# Patient Record
Sex: Female | Born: 1945 | Race: White | Hispanic: No | Marital: Married
Health system: Southern US, Community
[De-identification: ages and names within clinical notes are randomized; demographics above are authoritative.]

---

## 1997-05-07 ENCOUNTER — Ambulatory Visit (HOSPITAL_COMMUNITY): Admission: RE | Admit: 1997-05-07 | Discharge: 1997-05-07 | Payer: Self-pay | Admitting: Family Medicine

## 1998-02-19 ENCOUNTER — Other Ambulatory Visit: Admission: RE | Admit: 1998-02-19 | Discharge: 1998-02-19 | Payer: Self-pay | Admitting: Family Medicine

## 1998-07-22 ENCOUNTER — Ambulatory Visit (HOSPITAL_COMMUNITY): Admission: RE | Admit: 1998-07-22 | Discharge: 1998-07-22 | Payer: Self-pay | Admitting: Specialist

## 1998-07-22 ENCOUNTER — Encounter: Payer: Self-pay | Admitting: Specialist

## 1999-06-26 ENCOUNTER — Other Ambulatory Visit: Admission: RE | Admit: 1999-06-26 | Discharge: 1999-06-26 | Payer: Self-pay | Admitting: *Deleted

## 1999-09-25 ENCOUNTER — Encounter: Payer: Self-pay | Admitting: *Deleted

## 1999-09-25 ENCOUNTER — Ambulatory Visit (HOSPITAL_COMMUNITY): Admission: RE | Admit: 1999-09-25 | Discharge: 1999-09-25 | Payer: Self-pay | Admitting: *Deleted

## 2000-09-30 ENCOUNTER — Encounter: Payer: Self-pay | Admitting: *Deleted

## 2000-09-30 ENCOUNTER — Ambulatory Visit (HOSPITAL_COMMUNITY): Admission: RE | Admit: 2000-09-30 | Discharge: 2000-09-30 | Payer: Self-pay | Admitting: *Deleted

## 2002-01-09 ENCOUNTER — Ambulatory Visit (HOSPITAL_COMMUNITY): Admission: RE | Admit: 2002-01-09 | Discharge: 2002-01-09 | Payer: Self-pay | Admitting: *Deleted

## 2002-01-09 ENCOUNTER — Encounter: Payer: Self-pay | Admitting: *Deleted

## 2003-03-28 ENCOUNTER — Ambulatory Visit (HOSPITAL_COMMUNITY): Admission: RE | Admit: 2003-03-28 | Discharge: 2003-03-28 | Payer: Self-pay | Admitting: Family Medicine

## 2003-07-09 ENCOUNTER — Other Ambulatory Visit: Admission: RE | Admit: 2003-07-09 | Discharge: 2003-07-09 | Payer: Self-pay | Admitting: Family Medicine

## 2003-07-18 ENCOUNTER — Ambulatory Visit (HOSPITAL_COMMUNITY): Admission: RE | Admit: 2003-07-18 | Discharge: 2003-07-18 | Payer: Self-pay | Admitting: Gastroenterology

## 2004-07-03 ENCOUNTER — Ambulatory Visit (HOSPITAL_COMMUNITY): Admission: RE | Admit: 2004-07-03 | Discharge: 2004-07-03 | Payer: Self-pay | Admitting: Family Medicine

## 2004-11-17 ENCOUNTER — Other Ambulatory Visit: Admission: RE | Admit: 2004-11-17 | Discharge: 2004-11-17 | Payer: Self-pay | Admitting: *Deleted

## 2005-10-28 ENCOUNTER — Ambulatory Visit (HOSPITAL_COMMUNITY): Admission: RE | Admit: 2005-10-28 | Discharge: 2005-10-28 | Payer: Self-pay | Admitting: Family Medicine

## 2005-11-25 ENCOUNTER — Other Ambulatory Visit: Admission: RE | Admit: 2005-11-25 | Discharge: 2005-11-25 | Payer: Self-pay | Admitting: *Deleted

## 2007-03-13 ENCOUNTER — Ambulatory Visit (HOSPITAL_COMMUNITY): Admission: RE | Admit: 2007-03-13 | Discharge: 2007-03-13 | Payer: Self-pay | Admitting: Family Medicine

## 2007-04-20 ENCOUNTER — Other Ambulatory Visit: Admission: RE | Admit: 2007-04-20 | Discharge: 2007-04-20 | Payer: Self-pay | Admitting: Family Medicine

## 2008-05-01 ENCOUNTER — Ambulatory Visit (HOSPITAL_COMMUNITY): Admission: RE | Admit: 2008-05-01 | Discharge: 2008-05-01 | Payer: Self-pay | Admitting: Family Medicine

## 2008-05-13 ENCOUNTER — Other Ambulatory Visit: Admission: RE | Admit: 2008-05-13 | Discharge: 2008-05-13 | Payer: Self-pay | Admitting: Family Medicine

## 2009-06-18 ENCOUNTER — Ambulatory Visit (HOSPITAL_COMMUNITY): Admission: RE | Admit: 2009-06-18 | Discharge: 2009-06-18 | Payer: Self-pay | Admitting: Family Medicine

## 2009-11-21 ENCOUNTER — Other Ambulatory Visit: Admission: RE | Admit: 2009-11-21 | Discharge: 2009-11-21 | Payer: Self-pay | Admitting: Family Medicine

## 2010-03-08 ENCOUNTER — Encounter: Payer: Self-pay | Admitting: Family Medicine

## 2010-07-03 NOTE — Op Note (Signed)
[name], Sharon Gomez                          ACCOUNT NO.:  [id number]   MEDICAL RECORD NO.:  [id number]                   PATIENT TYPE:  AMB   LOCATION:  ENDO                                 FACILITY:  [facility]   PHYSICIAN:  Bernette Redbird, M.D.                DATE OF BIRTH:  October 12, 1945   DATE OF PROCEDURE:  07/18/2003  DATE OF DISCHARGE:                                 OPERATIVE REPORT   PROCEDURE:  Colonoscopy.   INDICATION:  Screening for colon cancer.   FINDINGS:  Mild sigmoid diverticulosis.   DESCRIPTION OF PROCEDURE:  The nature, purpose, and risks of the procedure  had been discussed with the patient who provided written consent.  Sedation  was fentanyl 50 mcg and Versed 4 mg IV without arrhythmias or desaturation.  The Olympus adjustable-tension pediatric video colonoscope was advanced to  the cecum, turning the patient into the supine position, then the right  lateral decubitus position, and finally to the left lateral decubitus  position with some external abdominal compression to get the tip of the  scope down to the base of the cecum where the appendiceal orifice was  readily visualized.  Pullback was then performed.  The quality of the prep  was very good, and it is felt that all areas were well seen.   There was minimal to mild sigmoid diverticulosis, but this was otherwise a  normal exam without evidence of polyps, cancer, colitis, or vascular  malformations.  Retroflexion in the rectum was unremarkable.  No biopsies  were obtained.  The patient tolerated the procedure well, and there were no  apparent complications.   IMPRESSION:  Normal screening colonoscopy except for very mild sigmoid  diverticulosis (V76.51).   PLAN:  Consider sigmoidoscopic screening in 5 years since the patient has no  worrisome risk factors or symptoms.                                               Bernette Redbird, M.D.    RB/MEDQ  D:  07/18/2003  T:  07/18/2003  Job:  [id number]   cc:    Clovis Riley, FNP

## 2010-09-07 ENCOUNTER — Other Ambulatory Visit (HOSPITAL_COMMUNITY): Payer: Self-pay | Admitting: Family Medicine

## 2010-09-07 DIAGNOSIS — Z1231 Encounter for screening mammogram for malignant neoplasm of breast: Secondary | ICD-10-CM

## 2010-09-10 ENCOUNTER — Ambulatory Visit (HOSPITAL_COMMUNITY)
Admission: RE | Admit: 2010-09-10 | Discharge: 2010-09-10 | Disposition: A | Discharge status: Home / Self Care | Payer: BC Managed Care – PPO | Source: Ambulatory Visit | Attending: Family Medicine | Admitting: Family Medicine

## 2010-09-10 DIAGNOSIS — Z1231 Encounter for screening mammogram for malignant neoplasm of breast: Secondary | ICD-10-CM | POA: Insufficient documentation

## 2010-12-01 ENCOUNTER — Other Ambulatory Visit: Payer: Self-pay | Admitting: Family Medicine

## 2010-12-01 ENCOUNTER — Other Ambulatory Visit (HOSPITAL_COMMUNITY): Payer: Self-pay | Admitting: Family Medicine

## 2010-12-01 ENCOUNTER — Other Ambulatory Visit (HOSPITAL_COMMUNITY)
Admission: RE | Admit: 2010-12-01 | Discharge: 2010-12-01 | Disposition: A | Discharge status: Home / Self Care | Payer: Medicare Other | Source: Ambulatory Visit | Attending: Family Medicine | Admitting: Family Medicine

## 2010-12-01 DIAGNOSIS — Z124 Encounter for screening for malignant neoplasm of cervix: Secondary | ICD-10-CM | POA: Insufficient documentation

## 2010-12-01 DIAGNOSIS — M8580 Other specified disorders of bone density and structure, unspecified site: Secondary | ICD-10-CM

## 2010-12-28 ENCOUNTER — Ambulatory Visit (HOSPITAL_COMMUNITY): Admission: RE | Admit: 2010-12-28 | Payer: BC Managed Care – PPO | Source: Ambulatory Visit

## 2011-11-10 ENCOUNTER — Other Ambulatory Visit (HOSPITAL_COMMUNITY): Payer: Self-pay | Admitting: Family Medicine

## 2011-11-10 DIAGNOSIS — Z1231 Encounter for screening mammogram for malignant neoplasm of breast: Secondary | ICD-10-CM

## 2011-11-26 ENCOUNTER — Ambulatory Visit (HOSPITAL_COMMUNITY): Payer: Medicare Other

## 2011-12-15 ENCOUNTER — Other Ambulatory Visit (HOSPITAL_COMMUNITY): Payer: Self-pay | Admitting: Family Medicine

## 2011-12-15 DIAGNOSIS — Z1231 Encounter for screening mammogram for malignant neoplasm of breast: Secondary | ICD-10-CM

## 2011-12-16 ENCOUNTER — Ambulatory Visit (HOSPITAL_COMMUNITY)
Admission: RE | Admit: 2011-12-16 | Discharge: 2011-12-16 | Disposition: A | Discharge status: Home / Self Care | Payer: Medicare Other | Source: Ambulatory Visit | Attending: Family Medicine | Admitting: Family Medicine

## 2011-12-16 DIAGNOSIS — Z1231 Encounter for screening mammogram for malignant neoplasm of breast: Secondary | ICD-10-CM

## 2012-05-18 ENCOUNTER — Other Ambulatory Visit (HOSPITAL_COMMUNITY)
Admission: RE | Admit: 2012-05-18 | Discharge: 2012-05-18 | Disposition: A | Discharge status: Home / Self Care | Payer: Medicare Other | Source: Ambulatory Visit | Attending: Family Medicine | Admitting: Family Medicine

## 2012-05-18 ENCOUNTER — Other Ambulatory Visit: Payer: Self-pay | Admitting: Family Medicine

## 2012-05-18 DIAGNOSIS — Z124 Encounter for screening for malignant neoplasm of cervix: Secondary | ICD-10-CM | POA: Insufficient documentation

## 2013-05-10 ENCOUNTER — Other Ambulatory Visit (HOSPITAL_COMMUNITY): Payer: Self-pay | Admitting: Family Medicine

## 2013-05-10 DIAGNOSIS — Z1231 Encounter for screening mammogram for malignant neoplasm of breast: Secondary | ICD-10-CM

## 2013-05-15 ENCOUNTER — Ambulatory Visit (HOSPITAL_COMMUNITY)
Admission: RE | Admit: 2013-05-15 | Discharge: 2013-05-15 | Disposition: A | Discharge status: Home / Self Care | Payer: Medicare Other | Source: Ambulatory Visit | Attending: Family Medicine | Admitting: Family Medicine

## 2013-05-15 DIAGNOSIS — Z1231 Encounter for screening mammogram for malignant neoplasm of breast: Secondary | ICD-10-CM | POA: Insufficient documentation

## 2013-05-17 ENCOUNTER — Other Ambulatory Visit: Payer: Self-pay | Admitting: Family Medicine

## 2013-05-17 DIAGNOSIS — R928 Other abnormal and inconclusive findings on diagnostic imaging of breast: Secondary | ICD-10-CM

## 2013-05-25 ENCOUNTER — Ambulatory Visit
Admission: RE | Admit: 2013-05-25 | Discharge: 2013-05-25 | Disposition: A | Discharge status: Home / Self Care | Payer: Medicare Other | Source: Ambulatory Visit | Attending: Family Medicine | Admitting: Family Medicine

## 2013-05-25 ENCOUNTER — Ambulatory Visit
Admission: RE | Admit: 2013-05-25 | Discharge: 2013-05-25 | Disposition: A | Discharge status: Home / Self Care | Payer: BC Managed Care – PPO | Source: Ambulatory Visit | Attending: Family Medicine | Admitting: Family Medicine

## 2013-05-25 DIAGNOSIS — R928 Other abnormal and inconclusive findings on diagnostic imaging of breast: Secondary | ICD-10-CM

## 2013-11-14 ENCOUNTER — Other Ambulatory Visit: Payer: Self-pay | Admitting: Family Medicine

## 2013-11-14 DIAGNOSIS — N6310 Unspecified lump in the right breast, unspecified quadrant: Secondary | ICD-10-CM

## 2013-11-19 ENCOUNTER — Encounter (INDEPENDENT_AMBULATORY_CARE_PROVIDER_SITE_OTHER): Payer: Self-pay

## 2013-11-19 ENCOUNTER — Ambulatory Visit
Admission: RE | Admit: 2013-11-19 | Discharge: 2013-11-19 | Disposition: A | Discharge status: Home / Self Care | Payer: Medicare Other | Source: Ambulatory Visit | Attending: Family Medicine | Admitting: Family Medicine

## 2013-11-19 ENCOUNTER — Other Ambulatory Visit: Payer: Self-pay | Admitting: Family Medicine

## 2013-11-19 DIAGNOSIS — N6310 Unspecified lump in the right breast, unspecified quadrant: Secondary | ICD-10-CM

## 2013-12-26 ENCOUNTER — Other Ambulatory Visit: Payer: Self-pay | Admitting: Gastroenterology

## 2014-05-27 ENCOUNTER — Other Ambulatory Visit: Payer: Self-pay | Admitting: Family Medicine

## 2014-05-27 DIAGNOSIS — N6310 Unspecified lump in the right breast, unspecified quadrant: Secondary | ICD-10-CM

## 2014-05-31 ENCOUNTER — Ambulatory Visit
Admission: RE | Admit: 2014-05-31 | Discharge: 2014-05-31 | Disposition: A | Discharge status: Home / Self Care | Payer: Medicare Other | Source: Ambulatory Visit | Attending: Family Medicine | Admitting: Family Medicine

## 2014-05-31 DIAGNOSIS — N6310 Unspecified lump in the right breast, unspecified quadrant: Secondary | ICD-10-CM

## 2015-05-27 ENCOUNTER — Other Ambulatory Visit: Payer: Self-pay | Admitting: Family Medicine

## 2015-05-27 DIAGNOSIS — N6489 Other specified disorders of breast: Secondary | ICD-10-CM

## 2015-06-03 ENCOUNTER — Ambulatory Visit
Admission: RE | Admit: 2015-06-03 | Discharge: 2015-06-03 | Disposition: A | Discharge status: Home / Self Care | Payer: Medicare Other | Source: Ambulatory Visit | Attending: Family Medicine | Admitting: Family Medicine

## 2015-06-03 DIAGNOSIS — N6489 Other specified disorders of breast: Secondary | ICD-10-CM

## 2015-08-13 ENCOUNTER — Other Ambulatory Visit: Payer: Self-pay | Admitting: Family Medicine

## 2015-08-13 ENCOUNTER — Other Ambulatory Visit (HOSPITAL_COMMUNITY)
Admission: RE | Admit: 2015-08-13 | Discharge: 2015-08-13 | Disposition: A | Discharge status: Home / Self Care | Payer: Medicare Other | Source: Ambulatory Visit | Attending: Family Medicine | Admitting: Family Medicine

## 2015-08-13 DIAGNOSIS — Z124 Encounter for screening for malignant neoplasm of cervix: Secondary | ICD-10-CM | POA: Insufficient documentation

## 2015-08-15 LAB — CYTOLOGY - PAP

## 2016-07-06 ENCOUNTER — Other Ambulatory Visit: Payer: Self-pay | Admitting: Family Medicine

## 2016-07-06 DIAGNOSIS — Z1231 Encounter for screening mammogram for malignant neoplasm of breast: Secondary | ICD-10-CM

## 2016-07-20 ENCOUNTER — Ambulatory Visit
Admission: RE | Admit: 2016-07-20 | Discharge: 2016-07-20 | Disposition: A | Discharge status: Home / Self Care | Payer: Medicare Other | Source: Ambulatory Visit | Attending: Family Medicine | Admitting: Family Medicine

## 2016-07-20 DIAGNOSIS — Z1231 Encounter for screening mammogram for malignant neoplasm of breast: Secondary | ICD-10-CM

## 2017-07-21 ENCOUNTER — Other Ambulatory Visit: Payer: Self-pay | Admitting: Family Medicine

## 2017-07-21 DIAGNOSIS — Z1231 Encounter for screening mammogram for malignant neoplasm of breast: Secondary | ICD-10-CM

## 2017-07-22 ENCOUNTER — Ambulatory Visit
Admission: RE | Admit: 2017-07-22 | Discharge: 2017-07-22 | Disposition: A | Discharge status: Home / Self Care | Payer: Medicare Other | Source: Ambulatory Visit | Attending: Family Medicine | Admitting: Family Medicine

## 2017-07-22 DIAGNOSIS — Z1231 Encounter for screening mammogram for malignant neoplasm of breast: Secondary | ICD-10-CM

## 2018-09-26 ENCOUNTER — Other Ambulatory Visit: Payer: Self-pay | Admitting: Family Medicine

## 2018-09-26 DIAGNOSIS — Z1231 Encounter for screening mammogram for malignant neoplasm of breast: Secondary | ICD-10-CM

## 2018-11-08 ENCOUNTER — Other Ambulatory Visit: Payer: Self-pay

## 2018-11-08 ENCOUNTER — Ambulatory Visit
Admission: RE | Admit: 2018-11-08 | Discharge: 2018-11-08 | Disposition: A | Discharge status: Home / Self Care | Payer: Medicare Other | Source: Ambulatory Visit | Attending: Family Medicine | Admitting: Family Medicine

## 2018-11-08 DIAGNOSIS — Z1231 Encounter for screening mammogram for malignant neoplasm of breast: Secondary | ICD-10-CM

## 2019-11-26 ENCOUNTER — Other Ambulatory Visit: Payer: Self-pay | Admitting: Family Medicine

## 2019-11-26 DIAGNOSIS — Z1231 Encounter for screening mammogram for malignant neoplasm of breast: Secondary | ICD-10-CM

## 2019-12-31 ENCOUNTER — Ambulatory Visit
Admission: RE | Admit: 2019-12-31 | Discharge: 2019-12-31 | Disposition: A | Discharge status: Home / Self Care | Payer: Medicare Other | Source: Ambulatory Visit | Attending: Family Medicine | Admitting: Family Medicine

## 2019-12-31 ENCOUNTER — Other Ambulatory Visit: Payer: Self-pay

## 2019-12-31 DIAGNOSIS — Z1231 Encounter for screening mammogram for malignant neoplasm of breast: Secondary | ICD-10-CM | POA: Diagnosis not present

## 2020-01-21 DIAGNOSIS — M8580 Other specified disorders of bone density and structure, unspecified site: Secondary | ICD-10-CM | POA: Diagnosis not present

## 2020-01-21 DIAGNOSIS — Z0000 Encounter for general adult medical examination without abnormal findings: Secondary | ICD-10-CM | POA: Diagnosis not present

## 2020-01-21 DIAGNOSIS — E559 Vitamin D deficiency, unspecified: Secondary | ICD-10-CM | POA: Diagnosis not present

## 2020-01-21 DIAGNOSIS — J069 Acute upper respiratory infection, unspecified: Secondary | ICD-10-CM | POA: Diagnosis not present

## 2020-01-21 DIAGNOSIS — Z86010 Personal history of colonic polyps: Secondary | ICD-10-CM | POA: Diagnosis not present

## 2020-01-21 DIAGNOSIS — E7800 Pure hypercholesterolemia, unspecified: Secondary | ICD-10-CM | POA: Diagnosis not present

## 2020-01-21 DIAGNOSIS — R7303 Prediabetes: Secondary | ICD-10-CM | POA: Diagnosis not present

## 2020-01-21 DIAGNOSIS — Z1159 Encounter for screening for other viral diseases: Secondary | ICD-10-CM | POA: Diagnosis not present

## 2020-01-23 DIAGNOSIS — E7800 Pure hypercholesterolemia, unspecified: Secondary | ICD-10-CM | POA: Diagnosis not present

## 2020-01-23 DIAGNOSIS — Z86010 Personal history of colonic polyps: Secondary | ICD-10-CM | POA: Diagnosis not present

## 2020-01-23 DIAGNOSIS — R7303 Prediabetes: Secondary | ICD-10-CM | POA: Diagnosis not present

## 2020-01-23 DIAGNOSIS — Z0000 Encounter for general adult medical examination without abnormal findings: Secondary | ICD-10-CM | POA: Diagnosis not present

## 2020-01-23 DIAGNOSIS — M8580 Other specified disorders of bone density and structure, unspecified site: Secondary | ICD-10-CM | POA: Diagnosis not present

## 2020-01-23 DIAGNOSIS — Z20822 Contact with and (suspected) exposure to covid-19: Secondary | ICD-10-CM | POA: Diagnosis not present

## 2020-01-23 DIAGNOSIS — Z1159 Encounter for screening for other viral diseases: Secondary | ICD-10-CM | POA: Diagnosis not present

## 2020-01-23 DIAGNOSIS — J069 Acute upper respiratory infection, unspecified: Secondary | ICD-10-CM | POA: Diagnosis not present

## 2020-01-23 DIAGNOSIS — E559 Vitamin D deficiency, unspecified: Secondary | ICD-10-CM | POA: Diagnosis not present

## 2020-01-25 DIAGNOSIS — E559 Vitamin D deficiency, unspecified: Secondary | ICD-10-CM | POA: Diagnosis not present

## 2020-01-25 DIAGNOSIS — Z0000 Encounter for general adult medical examination without abnormal findings: Secondary | ICD-10-CM | POA: Diagnosis not present

## 2020-01-25 DIAGNOSIS — J069 Acute upper respiratory infection, unspecified: Secondary | ICD-10-CM | POA: Diagnosis not present

## 2020-01-25 DIAGNOSIS — M8580 Other specified disorders of bone density and structure, unspecified site: Secondary | ICD-10-CM | POA: Diagnosis not present

## 2020-01-25 DIAGNOSIS — E7800 Pure hypercholesterolemia, unspecified: Secondary | ICD-10-CM | POA: Diagnosis not present

## 2020-01-25 DIAGNOSIS — Z1159 Encounter for screening for other viral diseases: Secondary | ICD-10-CM | POA: Diagnosis not present

## 2020-01-25 DIAGNOSIS — Z86010 Personal history of colonic polyps: Secondary | ICD-10-CM | POA: Diagnosis not present

## 2020-01-25 DIAGNOSIS — R7303 Prediabetes: Secondary | ICD-10-CM | POA: Diagnosis not present

## 2020-02-06 ENCOUNTER — Other Ambulatory Visit: Payer: Self-pay | Admitting: Family Medicine

## 2020-02-06 DIAGNOSIS — M8580 Other specified disorders of bone density and structure, unspecified site: Secondary | ICD-10-CM

## 2020-09-08 DIAGNOSIS — E559 Vitamin D deficiency, unspecified: Secondary | ICD-10-CM | POA: Diagnosis not present

## 2020-09-08 DIAGNOSIS — R7303 Prediabetes: Secondary | ICD-10-CM | POA: Diagnosis not present

## 2020-09-08 DIAGNOSIS — E7800 Pure hypercholesterolemia, unspecified: Secondary | ICD-10-CM | POA: Diagnosis not present

## 2020-09-08 DIAGNOSIS — Z6831 Body mass index (BMI) 31.0-31.9, adult: Secondary | ICD-10-CM | POA: Diagnosis not present

## 2020-09-08 DIAGNOSIS — L259 Unspecified contact dermatitis, unspecified cause: Secondary | ICD-10-CM | POA: Diagnosis not present

## 2020-09-08 DIAGNOSIS — M8580 Other specified disorders of bone density and structure, unspecified site: Secondary | ICD-10-CM | POA: Diagnosis not present

## 2021-02-10 ENCOUNTER — Other Ambulatory Visit: Payer: Self-pay | Admitting: Home Modifications

## 2021-02-10 DIAGNOSIS — Z1231 Encounter for screening mammogram for malignant neoplasm of breast: Secondary | ICD-10-CM

## 2021-03-06 ENCOUNTER — Ambulatory Visit
Admission: RE | Admit: 2021-03-06 | Discharge: 2021-03-06 | Disposition: A | Discharge status: Home / Self Care | Payer: Medicare PPO | Source: Ambulatory Visit | Attending: Home Modifications | Admitting: Home Modifications

## 2021-03-06 DIAGNOSIS — Z1231 Encounter for screening mammogram for malignant neoplasm of breast: Secondary | ICD-10-CM | POA: Diagnosis not present

## 2021-03-10 DIAGNOSIS — Z0000 Encounter for general adult medical examination without abnormal findings: Secondary | ICD-10-CM | POA: Diagnosis not present

## 2021-03-10 DIAGNOSIS — Z6830 Body mass index (BMI) 30.0-30.9, adult: Secondary | ICD-10-CM | POA: Diagnosis not present

## 2021-03-10 DIAGNOSIS — E559 Vitamin D deficiency, unspecified: Secondary | ICD-10-CM | POA: Diagnosis not present

## 2021-03-10 DIAGNOSIS — R7303 Prediabetes: Secondary | ICD-10-CM | POA: Diagnosis not present

## 2021-03-10 DIAGNOSIS — Z86010 Personal history of colonic polyps: Secondary | ICD-10-CM | POA: Diagnosis not present

## 2021-03-10 DIAGNOSIS — M8580 Other specified disorders of bone density and structure, unspecified site: Secondary | ICD-10-CM | POA: Diagnosis not present

## 2021-03-10 DIAGNOSIS — E7800 Pure hypercholesterolemia, unspecified: Secondary | ICD-10-CM | POA: Diagnosis not present

## 2021-09-08 DIAGNOSIS — H2513 Age-related nuclear cataract, bilateral: Secondary | ICD-10-CM | POA: Diagnosis not present

## 2021-09-08 DIAGNOSIS — H43813 Vitreous degeneration, bilateral: Secondary | ICD-10-CM | POA: Diagnosis not present

## 2021-12-21 DIAGNOSIS — E7800 Pure hypercholesterolemia, unspecified: Secondary | ICD-10-CM | POA: Diagnosis not present

## 2021-12-21 DIAGNOSIS — R7303 Prediabetes: Secondary | ICD-10-CM | POA: Diagnosis not present

## 2021-12-21 DIAGNOSIS — Z6832 Body mass index (BMI) 32.0-32.9, adult: Secondary | ICD-10-CM | POA: Diagnosis not present

## 2021-12-21 DIAGNOSIS — M85851 Other specified disorders of bone density and structure, right thigh: Secondary | ICD-10-CM | POA: Diagnosis not present

## 2021-12-21 DIAGNOSIS — Z23 Encounter for immunization: Secondary | ICD-10-CM | POA: Diagnosis not present

## 2022-03-11 DIAGNOSIS — M85851 Other specified disorders of bone density and structure, right thigh: Secondary | ICD-10-CM | POA: Diagnosis not present

## 2022-03-11 DIAGNOSIS — Z0000 Encounter for general adult medical examination without abnormal findings: Secondary | ICD-10-CM | POA: Diagnosis not present

## 2022-03-11 DIAGNOSIS — L570 Actinic keratosis: Secondary | ICD-10-CM | POA: Diagnosis not present

## 2022-03-11 DIAGNOSIS — R030 Elevated blood-pressure reading, without diagnosis of hypertension: Secondary | ICD-10-CM | POA: Diagnosis not present

## 2022-03-11 DIAGNOSIS — Z1331 Encounter for screening for depression: Secondary | ICD-10-CM | POA: Diagnosis not present

## 2022-03-11 DIAGNOSIS — Z01419 Encounter for gynecological examination (general) (routine) without abnormal findings: Secondary | ICD-10-CM | POA: Diagnosis not present

## 2022-03-11 DIAGNOSIS — Z6832 Body mass index (BMI) 32.0-32.9, adult: Secondary | ICD-10-CM | POA: Diagnosis not present

## 2022-03-11 DIAGNOSIS — N952 Postmenopausal atrophic vaginitis: Secondary | ICD-10-CM | POA: Diagnosis not present

## 2022-03-11 DIAGNOSIS — Z23 Encounter for immunization: Secondary | ICD-10-CM | POA: Diagnosis not present

## 2022-03-16 ENCOUNTER — Other Ambulatory Visit: Payer: Self-pay | Admitting: Family Medicine

## 2022-03-16 DIAGNOSIS — M85851 Other specified disorders of bone density and structure, right thigh: Secondary | ICD-10-CM

## 2022-04-08 ENCOUNTER — Other Ambulatory Visit: Payer: Self-pay | Admitting: Family Medicine

## 2022-04-08 DIAGNOSIS — Z1231 Encounter for screening mammogram for malignant neoplasm of breast: Secondary | ICD-10-CM

## 2022-04-12 ENCOUNTER — Ambulatory Visit
Admission: RE | Admit: 2022-04-12 | Discharge: 2022-04-12 | Disposition: A | Discharge status: Home / Self Care | Payer: Medicare PPO | Source: Ambulatory Visit | Attending: Family Medicine | Admitting: Family Medicine

## 2022-04-12 DIAGNOSIS — Z1231 Encounter for screening mammogram for malignant neoplasm of breast: Secondary | ICD-10-CM

## 2022-04-15 ENCOUNTER — Other Ambulatory Visit: Payer: Self-pay | Admitting: Family Medicine

## 2022-04-15 DIAGNOSIS — R928 Other abnormal and inconclusive findings on diagnostic imaging of breast: Secondary | ICD-10-CM

## 2022-04-29 ENCOUNTER — Ambulatory Visit
Admission: RE | Admit: 2022-04-29 | Discharge: 2022-04-29 | Disposition: A | Discharge status: Home / Self Care | Payer: Medicare PPO | Source: Ambulatory Visit | Attending: Family Medicine | Admitting: Family Medicine

## 2022-04-29 ENCOUNTER — Other Ambulatory Visit: Payer: Self-pay | Admitting: Family Medicine

## 2022-04-29 DIAGNOSIS — R928 Other abnormal and inconclusive findings on diagnostic imaging of breast: Secondary | ICD-10-CM

## 2022-04-29 DIAGNOSIS — R921 Mammographic calcification found on diagnostic imaging of breast: Secondary | ICD-10-CM

## 2022-05-12 ENCOUNTER — Ambulatory Visit
Admission: RE | Admit: 2022-05-12 | Discharge: 2022-05-12 | Disposition: A | Discharge status: Home / Self Care | Payer: Medicare PPO | Source: Ambulatory Visit | Attending: Family Medicine | Admitting: Family Medicine

## 2022-05-12 DIAGNOSIS — R921 Mammographic calcification found on diagnostic imaging of breast: Secondary | ICD-10-CM

## 2022-05-12 DIAGNOSIS — R928 Other abnormal and inconclusive findings on diagnostic imaging of breast: Secondary | ICD-10-CM

## 2022-05-12 DIAGNOSIS — N6011 Diffuse cystic mastopathy of right breast: Secondary | ICD-10-CM | POA: Diagnosis not present

## 2022-05-12 HISTORY — PX: BREAST BIOPSY: SHX20

## 2022-06-17 DIAGNOSIS — K648 Other hemorrhoids: Secondary | ICD-10-CM | POA: Diagnosis not present

## 2022-06-17 DIAGNOSIS — K5730 Diverticulosis of large intestine without perforation or abscess without bleeding: Secondary | ICD-10-CM | POA: Diagnosis not present

## 2022-06-17 DIAGNOSIS — Z09 Encounter for follow-up examination after completed treatment for conditions other than malignant neoplasm: Secondary | ICD-10-CM | POA: Diagnosis not present

## 2022-06-17 DIAGNOSIS — Z86010 Personal history of colonic polyps: Secondary | ICD-10-CM | POA: Diagnosis not present

## 2022-06-17 DIAGNOSIS — D122 Benign neoplasm of ascending colon: Secondary | ICD-10-CM | POA: Diagnosis not present

## 2022-06-17 DIAGNOSIS — D124 Benign neoplasm of descending colon: Secondary | ICD-10-CM | POA: Diagnosis not present

## 2022-06-21 DIAGNOSIS — D124 Benign neoplasm of descending colon: Secondary | ICD-10-CM | POA: Diagnosis not present

## 2022-06-21 DIAGNOSIS — D122 Benign neoplasm of ascending colon: Secondary | ICD-10-CM | POA: Diagnosis not present

## 2022-09-03 ENCOUNTER — Other Ambulatory Visit: Payer: Medicare PPO

## 2022-10-12 ENCOUNTER — Inpatient Hospital Stay: Admission: RE | Admit: 2022-10-12 | Payer: Medicare PPO | Source: Ambulatory Visit

## 2022-10-12 DIAGNOSIS — M85851 Other specified disorders of bone density and structure, right thigh: Secondary | ICD-10-CM

## 2022-10-12 DIAGNOSIS — E349 Endocrine disorder, unspecified: Secondary | ICD-10-CM | POA: Diagnosis not present

## 2022-10-12 DIAGNOSIS — N958 Other specified menopausal and perimenopausal disorders: Secondary | ICD-10-CM | POA: Diagnosis not present

## 2022-10-12 DIAGNOSIS — M8588 Other specified disorders of bone density and structure, other site: Secondary | ICD-10-CM | POA: Diagnosis not present

## 2023-01-27 IMAGING — MG MM DIGITAL SCREENING BILAT W/ TOMO AND CAD
8 series · 9 of 24 positions shown · non-contrast
Comparison: Previous exam(s).

CLINICAL DATA: Screening.

EXAM:
DIGITAL SCREENING BILATERAL MAMMOGRAM WITH TOMOSYNTHESIS AND CAD
TECHNIQUE: Bilateral screening digital craniocaudal and mediolateral oblique
mammograms were obtained. Bilateral screening digital breast
tomosynthesis was performed. The images were evaluated with
computer-aided detection.

[R CC synth-2D]
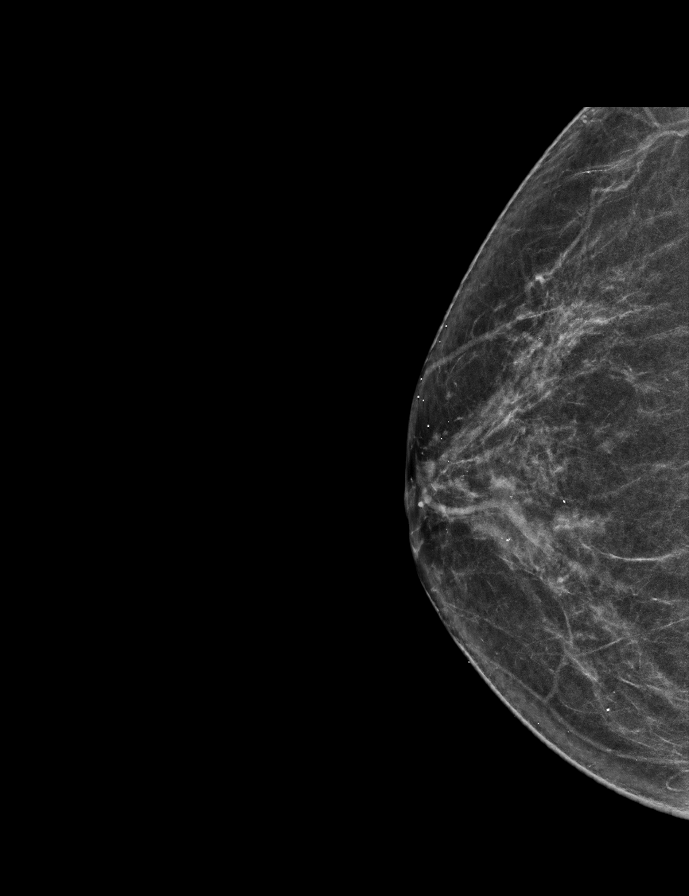

[L CC synth-2D]
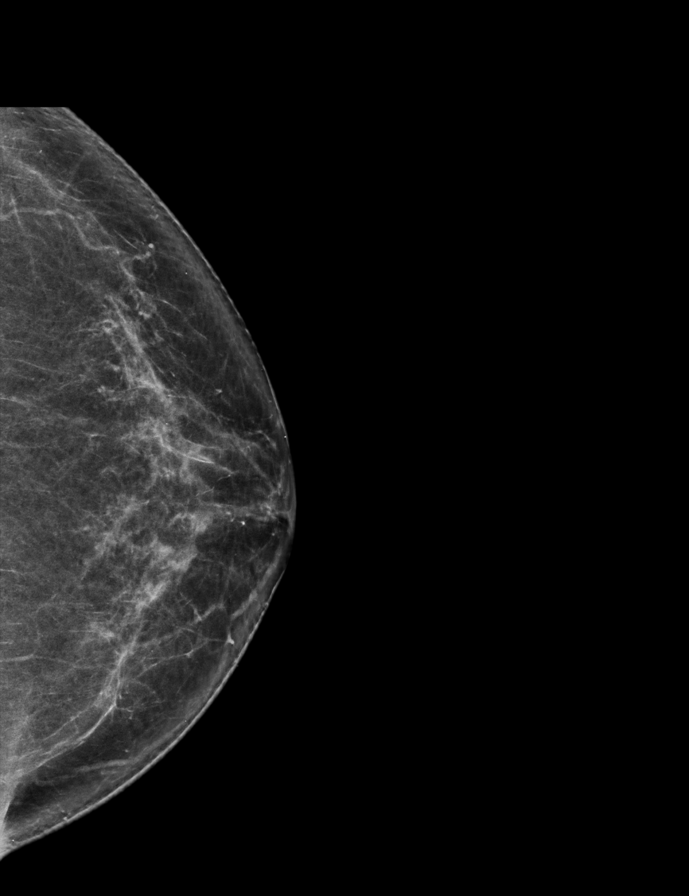

[L MLO synth-2D]
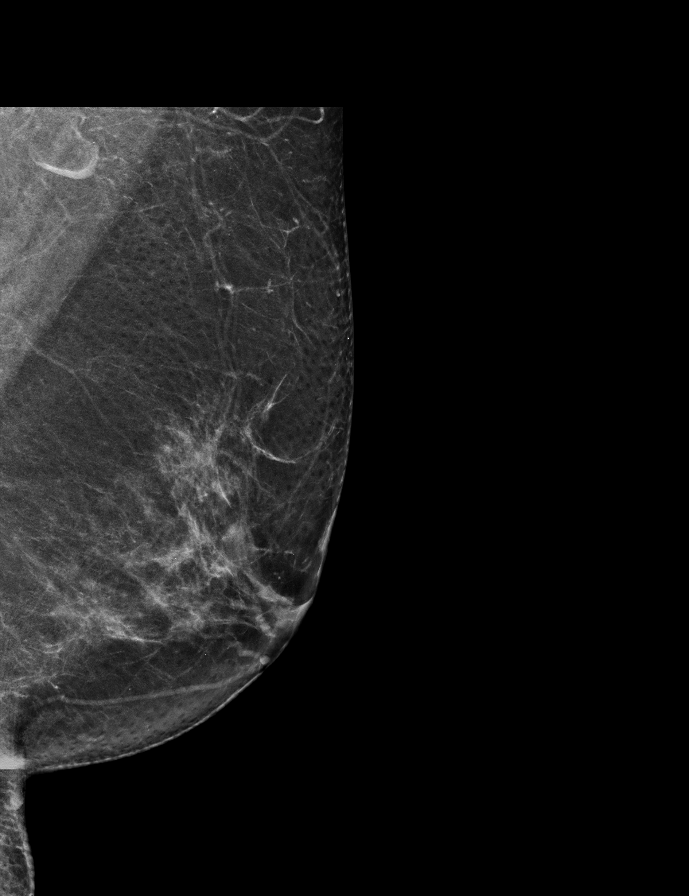

[R MLO synth-2D]
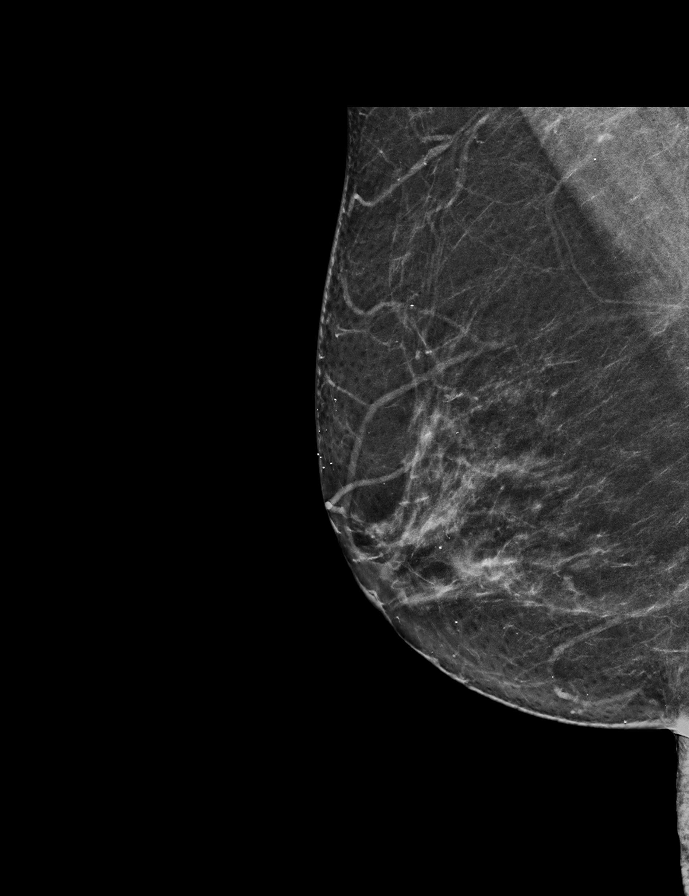

[L CC tomo · 2 of 68 frames shown]
[frame 22/68]
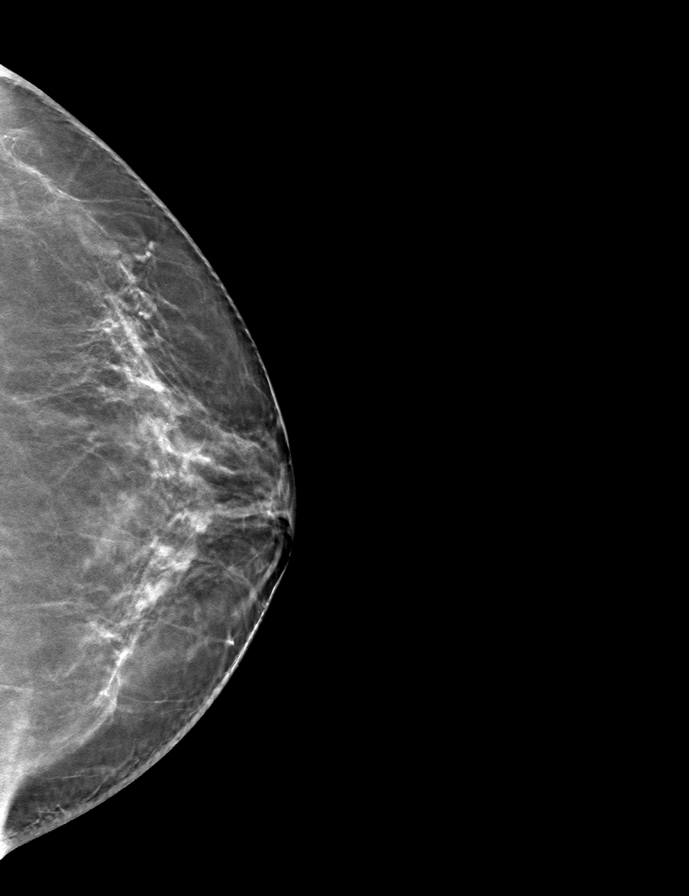
[frame 35/68]
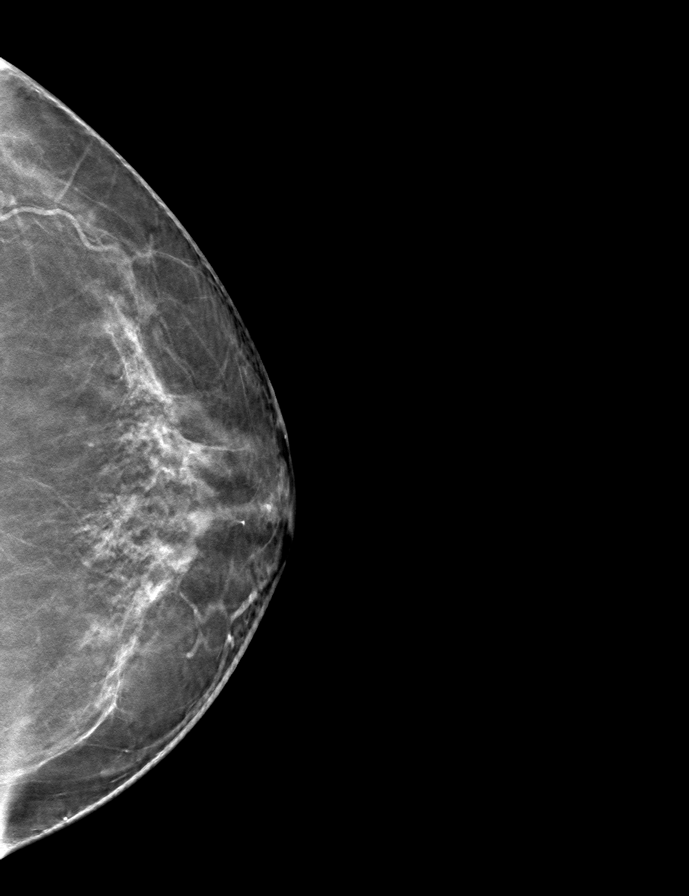

[R CC tomo · tomo slice 31/62.0]
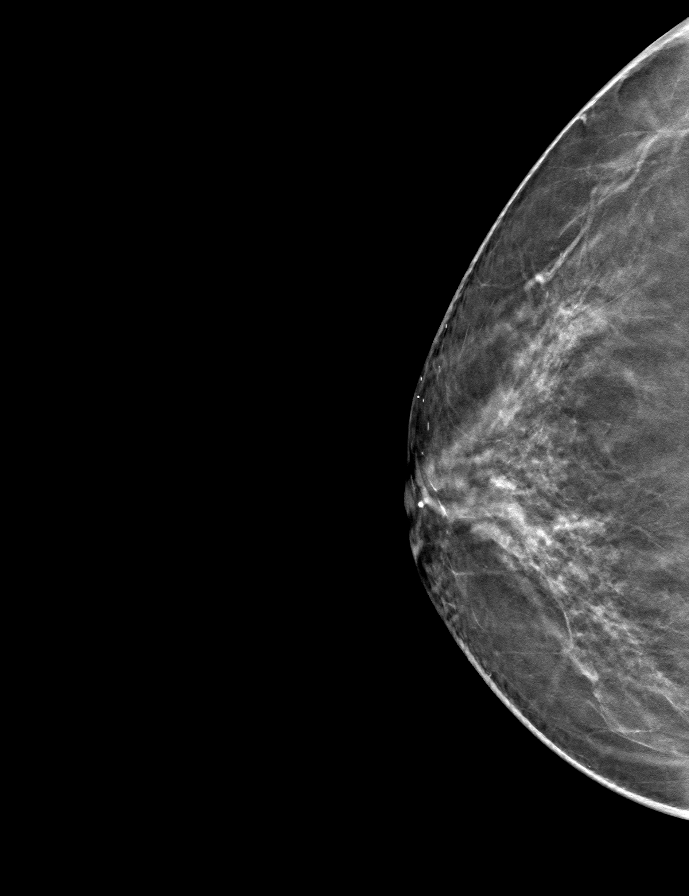

[R MLO tomo · tomo slice 33/65.0]
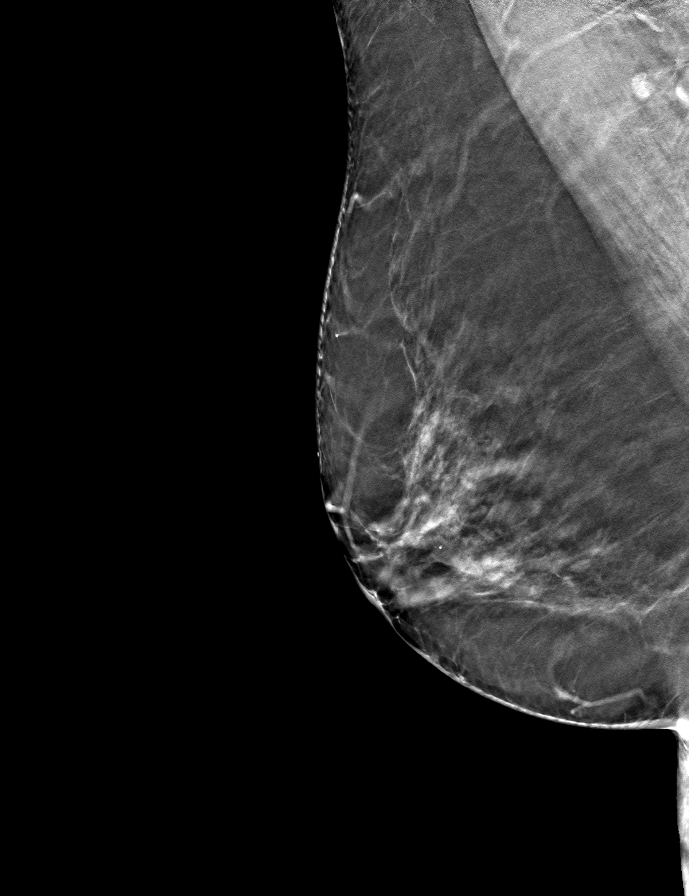

[L MLO tomo · tomo slice 36/71.0]
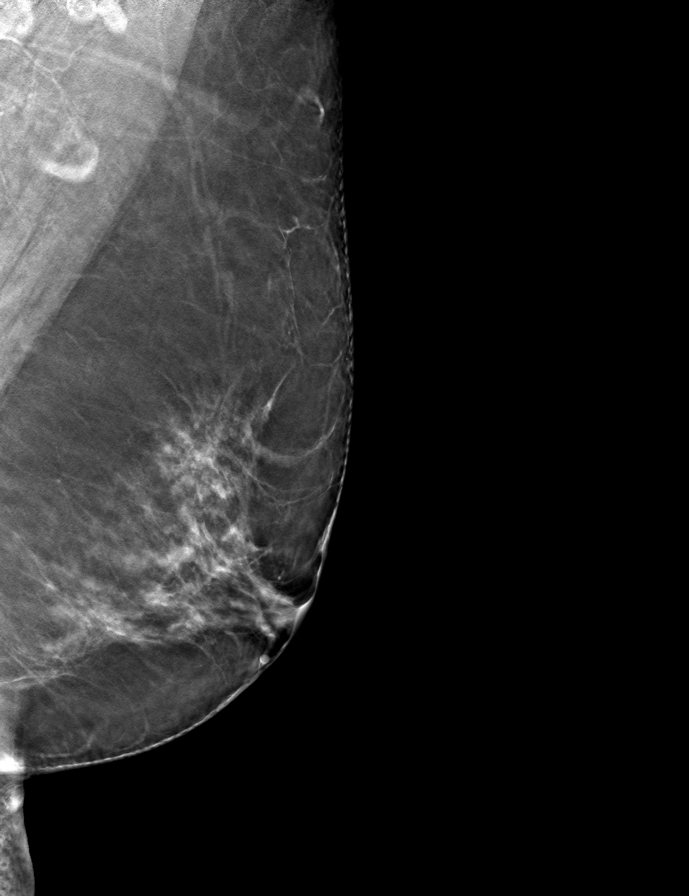

[9 of 24 positions shown; findings below may reference images not displayed]

ACR Breast Density Category c: The breast tissue is heterogeneously
dense, which may obscure small masses.
FINDINGS: There are no findings suspicious for malignancy.
IMPRESSION: No mammographic evidence of malignancy. A result letter of this
screening mammogram will be mailed directly to the patient.

RECOMMENDATION:
Screening mammogram in one year. (Code:Q3-W-BC3)

BI-RADS CATEGORY  1: Negative.

## 2023-03-02 ENCOUNTER — Other Ambulatory Visit: Payer: Medicare PPO

## 2023-05-18 ENCOUNTER — Other Ambulatory Visit: Payer: Self-pay | Admitting: Family Medicine

## 2023-05-18 DIAGNOSIS — Z1231 Encounter for screening mammogram for malignant neoplasm of breast: Secondary | ICD-10-CM

## 2023-05-24 ENCOUNTER — Ambulatory Visit
Admission: RE | Admit: 2023-05-24 | Discharge: 2023-05-24 | Disposition: A | Discharge status: Home / Self Care | Source: Ambulatory Visit | Attending: Family Medicine | Admitting: Family Medicine

## 2023-05-24 DIAGNOSIS — Z1231 Encounter for screening mammogram for malignant neoplasm of breast: Secondary | ICD-10-CM
